# Patient Record
Sex: Male | Born: 1950 | Race: White | Hispanic: No | Marital: Married | Smoking: Never smoker
Health system: Southern US, Community
[De-identification: ages and names within clinical notes are randomized; demographics above are authoritative.]

## PROBLEM LIST (undated history)

## (undated) DIAGNOSIS — Z789 Other specified health status: Secondary | ICD-10-CM

## (undated) HISTORY — PX: COLONOSCOPY: SHX174

---

## 1983-02-09 HISTORY — PX: BACK SURGERY: SHX140

## 1993-02-08 HISTORY — PX: ACHILLES TENDON REPAIR: SUR1153

## 2012-04-18 ENCOUNTER — Encounter (HOSPITAL_BASED_OUTPATIENT_CLINIC_OR_DEPARTMENT_OTHER): Payer: Self-pay | Admitting: *Deleted

## 2012-04-18 NOTE — Progress Notes (Signed)
Denies any ht or resp problems-says he is boring medically.

## 2012-04-19 NOTE — H&P (Signed)
Jordy Hewins/[facility] [address]   [address] [place], [place] [address] [phone] A Division of [facility]  Loreta Ave, M.D.     Robert A. Thurston Hole, M.D.     Lunette Stands, M.D. Eulas Post, M.D.    Buford Dresser, M.D. Estell Harpin, M.D. Genene Churn. Barry Dienes, PA-C            Kirstin A. Shepperson, PA-C Leon, OPA-C   RE: [place], [place]                                [phone]      DOB: Jun 22, 1950 INITIAL EVALUATION:  08-20-11 Paul Dickson comes in as a new patient, evaluation of ongoing worsening problems, left foot.  Insidious onset of symptoms last six months or greater.  Pain in the forefoot between the second and third metatarsal heads.  Gradual onset.  No trauma.  Getting worse rather than better.  Worse in the morning and when he walks.  Occasional shooting pain and numbness out into the second and third toes.  X-ray by his primary care physician, Dr. Reola Calkins, earlier this month he has brought with him.  No evidence of stress fracture.  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  No significant degenerative changes.   Entire history is reviewed, updated and included in the chart.  EXAMINATION: General exam is outlined and included in the chart.  Specifically, healthy appearing 62 year-old.  No significant flexible or fixed deformities in the lower leg, ankle, hindfoot, midfoot or forefoot.  Exquisitely tender between the second and third metatarsal heads on the left.  Very consistent with a neuroma.  No greater or lesser toe abnormalities.  Really nothing over the second MP joint.  Flexors and extensors are intact.  No clawing.    DISPOSITION:  Probable Morton's neuroma between the second and third metatarsal heads, left foot.  Discussed diagnosis and treatment.  We are going to do a diagnostic/therapeutic injection today.  If effective, nothing further.  We did talk at  relative length about definitive treatment with excision if the shot is ineffective.  He understands and agrees.  PROCEDURE NOTE: The patient's clinical condition is marked by substantial pain and/or significant functional disability.  Other conservative therapy has not provided relief, is contraindicated, or not appropriate.  There is a reasonable likelihood that injection will significantly improve the patient's pain and/or functional disability. After appropriate consent and under sterile condition injected from a dorsal approach between the second and third metatarsal heads down to the plantar side.  With Marcaine in place excellent relief of symptoms.  I will wait to hear from him.    Loreta Ave, M.D.   Electronically verified by Loreta Ave, M.D. DFM:jjh D 08-20-11 T 08-23-11  Naja Apperson/[facility] [address]   [address] [place], [place] [address] [phone] A Division of [facility]  Loreta Ave, M.D.   Robert A. Thurston Hole, M.D.   Burnell Blanks, M.D.   Eulas Post, M.D.   Lunette Stands, M.D Buford Dresser, M.D.  Charlsie Quest, M.D.   Estell Harpin, M.D.   Melina Fiddler, M.D. Genene Churn. Barry Dienes, PA-C            Kirstin A. Shepperson, PA-C [place], PA-C [place], [place]  RE: [place], [place]  [phone]      DOB: 11-10-1950 PHONE NOTE: 04-06-12 I spoke with Ramon Dredge on the phone today. Although he got initial relief from his injection with Marcaine into the interdigital space between the 2nd and 3rd metatarsal heads he only had a little improvement after that and it never completely resolved. As we discussed on his visit in July if the shot proved to confirm diagnosis but didn't effect a cure we're going to proceed with excision of his neuroma. It's been discussed with him. I'll see him at the time of intervention.  Loreta Ave, M.D.  Electronically verified by Loreta Ave,  M.D. DFM:kh D 04-06-12 T 04-07-12

## 2012-04-20 ENCOUNTER — Ambulatory Visit (HOSPITAL_BASED_OUTPATIENT_CLINIC_OR_DEPARTMENT_OTHER)
Admission: RE | Admit: 2012-04-20 | Discharge: 2012-04-20 | Disposition: A | Discharge status: Home / Self Care | Payer: BC Managed Care – PPO | Source: Ambulatory Visit | Attending: Orthopedic Surgery | Admitting: Orthopedic Surgery

## 2012-04-20 ENCOUNTER — Encounter (HOSPITAL_BASED_OUTPATIENT_CLINIC_OR_DEPARTMENT_OTHER): Payer: Self-pay

## 2012-04-20 ENCOUNTER — Ambulatory Visit (HOSPITAL_BASED_OUTPATIENT_CLINIC_OR_DEPARTMENT_OTHER): Payer: BC Managed Care – PPO | Admitting: *Deleted

## 2012-04-20 ENCOUNTER — Encounter (HOSPITAL_BASED_OUTPATIENT_CLINIC_OR_DEPARTMENT_OTHER)
Admission: RE | Disposition: A | Discharge status: Home / Self Care | Payer: Self-pay | Source: Ambulatory Visit | Attending: Orthopedic Surgery

## 2012-04-20 ENCOUNTER — Encounter (HOSPITAL_BASED_OUTPATIENT_CLINIC_OR_DEPARTMENT_OTHER): Payer: Self-pay | Admitting: *Deleted

## 2012-04-20 DIAGNOSIS — G5760 Lesion of plantar nerve, unspecified lower limb: Secondary | ICD-10-CM | POA: Insufficient documentation

## 2012-04-20 DIAGNOSIS — G5762 Lesion of plantar nerve, left lower limb: Secondary | ICD-10-CM

## 2012-04-20 HISTORY — DX: Other specified health status: Z78.9

## 2012-04-20 HISTORY — PX: EXCISION MORTON'S NEUROMA: SHX5013

## 2012-04-20 SURGERY — EXCISION, MORTON'S NEUROMA
Anesthesia: General | Site: Foot | Laterality: Left | Wound class: Clean

## 2012-04-20 MED ORDER — DEXAMETHASONE SODIUM PHOSPHATE 10 MG/ML IJ SOLN
INTRAMUSCULAR | Status: DC | PRN
  Administered 2012-04-20: 10 mg via INTRAVENOUS

## 2012-04-20 MED ORDER — FENTANYL CITRATE 0.05 MG/ML IJ SOLN
INTRAMUSCULAR | Status: DC | PRN
  Administered 2012-04-20: 100 ug via INTRAVENOUS
  Administered 2012-04-20: 50 ug via INTRAVENOUS

## 2012-04-20 MED ORDER — CEFAZOLIN SODIUM-DEXTROSE 2-3 GM-% IV SOLR
2.0000 g | INTRAVENOUS | Status: AC
  Administered 2012-04-20: 2 g via INTRAVENOUS

## 2012-04-20 MED ORDER — HYDROMORPHONE HCL PF 1 MG/ML IJ SOLN: 0.2500 mg | INTRAMUSCULAR | Status: DC | PRN

## 2012-04-20 MED ORDER — PROPOFOL 10 MG/ML IV BOLUS
INTRAVENOUS | Status: DC | PRN
  Administered 2012-04-20: 250 mg via INTRAVENOUS

## 2012-04-20 MED ORDER — MIDAZOLAM HCL 2 MG/2ML IJ SOLN: 1.0000 mg | INTRAMUSCULAR | Status: DC | PRN

## 2012-04-20 MED ORDER — BUPIVACAINE HCL (PF) 0.5 % IJ SOLN
INTRAMUSCULAR | Status: DC | PRN
  Administered 2012-04-20: 8 mL

## 2012-04-20 MED ORDER — OXYCODONE-ACETAMINOPHEN 5-325 MG PO TABS: 1.0000 | ORAL_TABLET | Freq: Four times a day (QID) | ORAL | Status: DC | PRN

## 2012-04-20 MED ORDER — OXYCODONE HCL 5 MG/5ML PO SOLN
5.0000 mg | Freq: Once | ORAL | Status: AC | PRN
Start: 2012-04-20 — End: 2012-04-20

## 2012-04-20 MED ORDER — LACTATED RINGERS IV SOLN
INTRAVENOUS | Status: DC
  Administered 2012-04-20 (×2): via INTRAVENOUS

## 2012-04-20 MED ORDER — MIDAZOLAM HCL 5 MG/5ML IJ SOLN
INTRAMUSCULAR | Status: DC | PRN
  Administered 2012-04-20: 2 mg via INTRAVENOUS

## 2012-04-20 MED ORDER — FENTANYL CITRATE 0.05 MG/ML IJ SOLN: 50.0000 ug | INTRAMUSCULAR | Status: DC | PRN

## 2012-04-20 MED ORDER — LIDOCAINE HCL (CARDIAC) 20 MG/ML IV SOLN
INTRAVENOUS | Status: DC | PRN
  Administered 2012-04-20: 60 mg via INTRAVENOUS

## 2012-04-20 MED ORDER — OXYCODONE HCL 5 MG PO TABS
5.0000 mg | ORAL_TABLET | Freq: Once | ORAL | Status: AC | PRN
  Administered 2012-04-20: 5 mg via ORAL

## 2012-04-20 SURGICAL SUPPLY — 53 items
BANDAGE ELASTIC 3 VELCRO ST LF (GAUZE/BANDAGES/DRESSINGS) ×2 IMPLANT
BANDAGE ELASTIC 4 VELCRO ST LF (GAUZE/BANDAGES/DRESSINGS) ×2 IMPLANT
BANDAGE GAUZE ELAST BULKY 4 IN (GAUZE/BANDAGES/DRESSINGS) ×2 IMPLANT
BLADE MINI RND TIP GREEN BEAV (BLADE) ×2 IMPLANT
BLADE SURG 15 STRL LF DISP TIS (BLADE) ×1 IMPLANT
BLADE SURG 15 STRL SS (BLADE) ×1
BNDG COHESIVE 4X5 TAN STRL (GAUZE/BANDAGES/DRESSINGS) ×2 IMPLANT
BNDG ELASTIC 2 VLCR STRL LF (GAUZE/BANDAGES/DRESSINGS) IMPLANT
BNDG ESMARK 4X9 LF (GAUZE/BANDAGES/DRESSINGS) ×2 IMPLANT
CLOTH BEACON ORANGE TIMEOUT ST (SAFETY) ×2 IMPLANT
CORDS BIPOLAR (ELECTRODE) IMPLANT
COVER MAYO STAND STRL (DRAPES) ×2 IMPLANT
COVER TABLE BACK 60X90 (DRAPES) ×2 IMPLANT
CUFF TOURNIQUET SINGLE 18IN (TOURNIQUET CUFF) IMPLANT
DECANTER SPIKE VIAL GLASS SM (MISCELLANEOUS) IMPLANT
DRAPE EXTREMITY T 121X128X90 (DRAPE) ×2 IMPLANT
DRAPE SURG 17X23 STRL (DRAPES) ×2 IMPLANT
DRAPE U 20/CS (DRAPES) ×2 IMPLANT
DRSG PAD ABDOMINAL 8X10 ST (GAUZE/BANDAGES/DRESSINGS) ×4 IMPLANT
DURAPREP 26ML APPLICATOR (WOUND CARE) ×2 IMPLANT
ELECT REM PT RETURN 9FT ADLT (ELECTROSURGICAL) ×2
ELECTRODE REM PT RTRN 9FT ADLT (ELECTROSURGICAL) ×1 IMPLANT
GAUZE XEROFORM 1X8 LF (GAUZE/BANDAGES/DRESSINGS) ×2 IMPLANT
GLOVE BIO SURGEON STRL SZ 6.5 (GLOVE) ×2 IMPLANT
GLOVE BIOGEL PI IND STRL 7.0 (GLOVE) ×1 IMPLANT
GLOVE BIOGEL PI IND STRL 8 (GLOVE) ×1 IMPLANT
GLOVE BIOGEL PI INDICATOR 7.0 (GLOVE) ×1
GLOVE BIOGEL PI INDICATOR 8 (GLOVE) ×1
GLOVE EXAM NITRILE EXT CUFF MD (GLOVE) ×2 IMPLANT
GLOVE ORTHO TXT STRL SZ7.5 (GLOVE) ×4 IMPLANT
GOWN BRE IMP PREV XXLGXLNG (GOWN DISPOSABLE) ×2 IMPLANT
GOWN PREVENTION PLUS XLARGE (GOWN DISPOSABLE) ×2 IMPLANT
GOWN PREVENTION PLUS XXLARGE (GOWN DISPOSABLE) ×2 IMPLANT
NEEDLE HYPO 22GX1.5 SAFETY (NEEDLE) ×2 IMPLANT
NEEDLE HYPO 25X1 1.5 SAFETY (NEEDLE) IMPLANT
NS IRRIG 1000ML POUR BTL (IV SOLUTION) ×2 IMPLANT
PACK BASIN DAY SURGERY FS (CUSTOM PROCEDURE TRAY) ×2 IMPLANT
PAD CAST 3X4 CTTN HI CHSV (CAST SUPPLIES) ×1 IMPLANT
PADDING CAST ABS 4INX4YD NS (CAST SUPPLIES) ×1
PADDING CAST ABS COTTON 4X4 ST (CAST SUPPLIES) ×1 IMPLANT
PADDING CAST COTTON 3X4 STRL (CAST SUPPLIES) ×1
PADDING UNDERCAST 2  STERILE (CAST SUPPLIES) IMPLANT
PENCIL BUTTON HOLSTER BLD 10FT (ELECTRODE) ×2 IMPLANT
SPONGE GAUZE 4X4 12PLY (GAUZE/BANDAGES/DRESSINGS) ×2 IMPLANT
STOCKINETTE 4X48 STRL (DRAPES) ×2 IMPLANT
SUT ETHILON 3 0 PS 1 (SUTURE) ×2 IMPLANT
SUT VIC AB 3-0 SH 27 (SUTURE)
SUT VIC AB 3-0 SH 27X BRD (SUTURE) IMPLANT
SYR BULB 3OZ (MISCELLANEOUS) ×2 IMPLANT
SYR CONTROL 10ML LL (SYRINGE) ×2 IMPLANT
TOWEL OR 17X24 6PK STRL BLUE (TOWEL DISPOSABLE) ×2 IMPLANT
UNDERPAD 30X30 INCONTINENT (UNDERPADS AND DIAPERS) ×2 IMPLANT
WATER STERILE IRR 1000ML POUR (IV SOLUTION) IMPLANT

## 2012-04-20 NOTE — Anesthesia Preprocedure Evaluation (Addendum)
Anesthesia Evaluation  Patient identified by MRN, date of birth, ID band Patient awake    Reviewed: Allergy & Precautions, H&P , NPO status , Patient's Chart, lab work & pertinent test results  Airway Mallampati: II TM Distance: >3 FB Neck ROM: Full    Dental no notable dental hx. (+) Teeth Intact and Dental Advisory Given   Pulmonary neg pulmonary ROS,  breath sounds clear to auscultation  Pulmonary exam normal       Cardiovascular negative cardio ROS  Rhythm:Regular Rate:Normal     Neuro/Psych negative neurological ROS  negative psych ROS   GI/Hepatic negative GI ROS, Neg liver ROS,   Endo/Other  negative endocrine ROS  Renal/GU negative Renal ROS  negative genitourinary   Musculoskeletal   Abdominal   Peds  Hematology negative hematology ROS (+)   Anesthesia Other Findings   Reproductive/Obstetrics negative OB ROS                           Anesthesia Physical Anesthesia Plan  ASA: I  Anesthesia Plan: General   Post-op Pain Management:    Induction: Intravenous  Airway Management Planned: LMA  Additional Equipment:   Intra-op Plan:   Post-operative Plan: Extubation in OR  Informed Consent: I have reviewed the patients History and Physical, chart, labs and discussed the procedure including the risks, benefits and alternatives for the proposed anesthesia with the patient or authorized representative who has indicated his/her understanding and acceptance.   Dental advisory given  Plan Discussed with: CRNA  Anesthesia Plan Comments:         Anesthesia Quick Evaluation  

## 2012-04-20 NOTE — Anesthesia Postprocedure Evaluation (Signed)
  Anesthesia Post-op Note  Patient: Paul Dickson  Procedure(s) Performed: Procedure(s) with comments: EXCISION INTERDIGITAL (MORTON'S) NEUROMA SINGLE EACH (Left) - LEFT FOOT MORTONS NEUROMA BETWEEN 2ND & 3RD METARSAL HEADS  Patient Location: PACU  Anesthesia Type:General  Level of Consciousness: awake, alert  and oriented  Airway and Oxygen Therapy: Patient Spontanous Breathing  Post-op Pain: mild  Post-op Assessment: Post-op Vital signs reviewed, Patient's Cardiovascular Status Stable, Respiratory Function Stable, Patent Airway and No signs of Nausea or vomiting  Post-op Vital Signs: Reviewed and stable  Complications: No apparent anesthesia complications

## 2012-04-20 NOTE — Brief Op Note (Signed)
04/20/2012  8:29 AM  PATIENT:  Paul Dickson  62 y.o. male  PRE-OPERATIVE DIAGNOSIS:  LEFT FOOT MORTONS NEUROMA BETWEEN 2nd and 3rd METATARSAL HEADS, 355.6  POST-OPERATIVE DIAGNOSIS:  LEFT FOOT MORTONS NEUROMA BETWEEN 2nd and 3rd METATARSAL HEADS, 355.6  PROCEDURE:  Procedure(s) with comments: EXCISION INTERDIGITAL (MORTON'S) NEUROMA SINGLE EACH (Left) - LEFT FOOT MORTONS NEUROMA BETWEEN 2ND & 3RD METARSAL HEADS  SURGEON:  Surgeon(s) and Role:    * Loreta Ave, MD - Primary  PHYSICIAN ASSISTANT: Zonia Kief M    ANESTHESIA:   general  EBL:  Total I/O In: 1000 [I.V.:1000] Out: -    SPECIMEN:  neuroma  DISPOSITION OF SPECIMEN:  PATHOLOGY  COUNTS:  YES  TOURNIQUET:   Total Tourniquet Time Documented: Calf (Left) - 21 minutes Total: Calf (Left) - 21 minutes   PATIENT DISPOSITION:  PACU - hemodynamically stable.

## 2012-04-20 NOTE — Interval H&P Note (Signed)
History and Physical Interval Note:  04/20/2012 7:32 AM  Paul Dickson  has presented today for surgery, with the diagnosis of LEFT FOOT MORTONS NEUROMA BETWEEN 2nd and 3rd METATARSAL HEADS, 355.6  The various methods of treatment have been discussed with the patient and family. After consideration of risks, benefits and other options for treatment, the patient has consented to  Procedure(s) with comments: EXCISION INTERDIGITAL (MORTON'S) NEUROMA SINGLE EACH (Left) - LEFT FOOT MORTONS NEUROMA BETWEEN 2ND & 3RD METARSAL HEADS as a surgical intervention .  The patient's history has been reviewed, patient examined, no change in status, stable for surgery.  I have reviewed the patient's chart and labs.  Questions were answered to the patient's satisfaction.     MURPHY,DANIEL F

## 2012-04-20 NOTE — Transfer of Care (Signed)
Immediate Anesthesia Transfer of Care Note  Patient: Paul Dickson  Procedure(s) Performed: Procedure(s) with comments: EXCISION INTERDIGITAL (MORTON'S) NEUROMA SINGLE EACH (Left) - LEFT FOOT MORTONS NEUROMA BETWEEN 2ND & 3RD METARSAL HEADS  Patient Location: PACU  Anesthesia Type:General  Level of Consciousness: awake, alert  and oriented  Airway & Oxygen Therapy: Patient Spontanous Breathing and Patient connected to face mask oxygen  Post-op Assessment: Report given to PACU RN, Post -op Vital signs reviewed and stable and Patient moving all extremities  Post vital signs: Reviewed and stable  Complications: No apparent anesthesia complications

## 2012-04-20 NOTE — Anesthesia Procedure Notes (Signed)
Procedure Name: LMA Insertion Date/Time: 04/20/2012 7:44 AM Performed by: Meyer Russel Pre-anesthesia Checklist: Patient identified, Emergency Drugs available, Suction available and Patient being monitored Patient Re-evaluated:Patient Re-evaluated prior to inductionOxygen Delivery Method: Circle System Utilized Preoxygenation: Pre-oxygenation with 100% oxygen Intubation Type: IV induction Ventilation: Mask ventilation without difficulty LMA: LMA inserted LMA Size: 5.0 Number of attempts: 1 Airway Equipment and Method: bite block Placement Confirmation: positive ETCO2 and breath sounds checked- equal and bilateral Tube secured with: Tape Dental Injury: Teeth and Oropharynx as per pre-operative assessment

## 2012-04-21 ENCOUNTER — Encounter (HOSPITAL_BASED_OUTPATIENT_CLINIC_OR_DEPARTMENT_OTHER): Payer: Self-pay | Admitting: Orthopedic Surgery

## 2012-04-24 NOTE — Op Note (Signed)
NAME:  Paul Dickson, Paul Dickson              ACCOUNT NO.:  [id number]  MEDICAL RECORD NO.:  [id number]  LOCATION:                               FACILITY:  [facility]  PHYSICIAN:  Loreta Ave, M.D. DATE OF BIRTH:  Jan 18, 1951  DATE OF PROCEDURE:  04/20/2012 DATE OF DISCHARGE:  04/20/2012                              OPERATIVE REPORT   PREOPERATIVE DIAGNOSIS:  Symptomatic Morton neuroma, left foot between second and third metatarsal heads.  POSTOPERATIVE DIAGNOSIS:  Symptomatic Morton neuroma, left foot between second and third metatarsal heads.  PROCEDURE:  Excision of Morton neuroma, left foot between second and third metatarsal heads.  SURGEON:  Loreta Ave, MD  ASSISTANT:  Genene Churn. Barry Dienes, PA  ANESTHESIA:  General.  BLOOD LOSS:  Minimal.  SPECIMENS:  Excised neuroma.  CULTURES:  None.  COMPLICATIONS:  None.  DRESSINGS:  Soft compressive wooden shoe.  TOURNIQUET TIME:  30 minutes.  PROCEDURE:  The patient was brought to the operating room, placed on the operating table in supine position.  After adequate anesthesia had been obtained, calf tourniquet applied.  Prepped and draped in usual sterile fashion.  Exsanguinated with elevation of Esmarch.  Tourniquet inflated to 250 mmHg.  Longitudinal incision in the web space between the second and third metatarsal heads.  Extended proximal and distal.  Web space exposed.  The enlarged Morton neuroma at the bifurcation of nerve identified just distal to metatarsal heads.  I split the intermetatarsal ligaments so I could get well proximal to excise this approximately, brought out distal and incised distally and resected it.  Sent for specimen.  No other significant abnormalities in web space.  Wound irrigated. Retractor removed.  Wound closed with nylon.  Margins were injected with Marcaine.  Sterile compressive dressing applied.  Tourniquet deflated and removed.  Anesthesia reversed.  Brought to the recovery room. Tolerated the  surgery well.  No complications.     Loreta Ave, M.D.     DFM/MEDQ  D:  04/20/2012  T:  04/21/2012  Job:  [id number]

## 2016-04-20 ENCOUNTER — Other Ambulatory Visit: Payer: Self-pay | Admitting: Surgery

## 2016-04-20 DIAGNOSIS — R2242 Localized swelling, mass and lump, left lower limb: Secondary | ICD-10-CM

## 2016-04-30 ENCOUNTER — Ambulatory Visit
Admission: RE | Admit: 2016-04-30 | Discharge: 2016-04-30 | Disposition: A | Discharge status: Home / Self Care | Payer: BLUE CROSS/BLUE SHIELD | Source: Ambulatory Visit | Attending: Surgery | Admitting: Surgery

## 2016-04-30 DIAGNOSIS — R2242 Localized swelling, mass and lump, left lower limb: Secondary | ICD-10-CM

## 2016-04-30 MED ORDER — GADOBENATE DIMEGLUMINE 529 MG/ML IV SOLN
16.0000 mL | Freq: Once | INTRAVENOUS | Status: AC | PRN
  Administered 2016-04-30: 16 mL via INTRAVENOUS

## 2016-05-04 ENCOUNTER — Other Ambulatory Visit (HOSPITAL_COMMUNITY): Payer: Self-pay | Admitting: Surgery

## 2016-05-04 DIAGNOSIS — R2242 Localized swelling, mass and lump, left lower limb: Secondary | ICD-10-CM

## 2016-05-13 ENCOUNTER — Other Ambulatory Visit: Payer: Self-pay | Admitting: Radiology

## 2016-05-14 ENCOUNTER — Encounter (HOSPITAL_COMMUNITY): Payer: Self-pay

## 2016-05-14 ENCOUNTER — Ambulatory Visit (HOSPITAL_COMMUNITY)
Admission: RE | Admit: 2016-05-14 | Discharge: 2016-05-14 | Disposition: A | Discharge status: Home / Self Care | Payer: BLUE CROSS/BLUE SHIELD | Source: Ambulatory Visit | Attending: Surgery | Admitting: Surgery

## 2016-05-14 DIAGNOSIS — R2242 Localized swelling, mass and lump, left lower limb: Secondary | ICD-10-CM | POA: Diagnosis not present

## 2016-05-14 LAB — CBC
HEMATOCRIT: 44.5 % (ref 39.0–52.0)
Hemoglobin: 14.9 g/dL (ref 13.0–17.0)
MCH: 31.5 pg (ref 26.0–34.0)
MCHC: 33.5 g/dL (ref 30.0–36.0)
MCV: 94.1 fL (ref 78.0–100.0)
Platelets: 262 10*3/uL (ref 150–400)
RBC: 4.73 MIL/uL (ref 4.22–5.81)
RDW: 13.8 % (ref 11.5–15.5)
WBC: 5.7 10*3/uL (ref 4.0–10.5)

## 2016-05-14 LAB — PROTIME-INR
INR: 0.99
Prothrombin Time: 13.1 seconds (ref 11.4–15.2)

## 2016-05-14 LAB — APTT: aPTT: 32 seconds (ref 24–36)

## 2016-05-14 MED ORDER — LIDOCAINE HCL 1 % IJ SOLN
INTRAMUSCULAR | Status: AC
  Filled 2016-05-14: qty 20

## 2016-05-14 MED ORDER — MIDAZOLAM HCL 2 MG/2ML IJ SOLN
INTRAMUSCULAR | Status: AC
  Filled 2016-05-14: qty 2

## 2016-05-14 MED ORDER — FENTANYL CITRATE (PF) 100 MCG/2ML IJ SOLN
INTRAMUSCULAR | Status: AC
  Filled 2016-05-14: qty 2

## 2016-05-14 MED ORDER — FENTANYL CITRATE (PF) 100 MCG/2ML IJ SOLN
INTRAMUSCULAR | Status: AC | PRN
  Administered 2016-05-14 (×2): 50 ug via INTRAVENOUS

## 2016-05-14 MED ORDER — SODIUM CHLORIDE 0.9 % IV SOLN: INTRAVENOUS | Status: DC

## 2016-05-14 MED ORDER — MIDAZOLAM HCL 2 MG/2ML IJ SOLN
INTRAMUSCULAR | Status: AC | PRN
  Administered 2016-05-14 (×2): 1 mg via INTRAVENOUS

## 2016-05-14 NOTE — Discharge Instructions (Signed)
Needle Biopsy, Care After These instructions give you information about caring for yourself after your procedure. Your doctor may also give you more specific instructions. Call your doctor if you have any problems or questions after your procedure. Follow these instructions at home:  Rest as told by your doctor.  Take medicines only as told by your doctor.  There are many different ways to close and cover the biopsy site, including stitches (sutures), skin glue, and adhesive strips. Follow instructions from your doctor about:  How to take care of your biopsy site.  When and how you should change your bandage (dressing).  When you should remove your dressing.  Removing whatever was used to close your biopsy site.  Check your biopsy site every day for signs of infection. Watch for:  Redness, swelling, or pain.  Fluid, blood, or pus. Contact a doctor if:  You have a fever.  You have redness, swelling, or pain at the biopsy site, and it lasts longer than a few days.  You have fluid, blood, or pus coming from the biopsy site.  You feel sick to your stomach (nauseous).  You throw up (vomit). Get help right away if:  You are short of breath.  You have trouble breathing.  Your chest hurts.  You feel dizzy or you pass out (faint).  You have bleeding that does not stop with pressure or a bandage.  You cough up blood.  Your belly (abdomen) hurts. This information is not intended to replace advice given to you by your health care provider. Make sure you discuss any questions you have with your health care provider. Document Released: 01/08/2008 Document Revised: 07/03/2015 Document Reviewed: 01/21/2014 Elsevier Interactive Patient Education  2017 [employer]. Moderate Conscious Sedation, Adult, Care After These instructions provide you with information about caring for yourself after your procedure. Your health care provider may also give you more specific instructions.  Your treatment has been planned according to current medical practices, but problems sometimes occur. Call your health care provider if you have any problems or questions after your procedure. What can I expect after the procedure? After your procedure, it is common:  To feel sleepy for several hours.  To feel clumsy and have poor balance for several hours.  To have poor judgment for several hours.  To vomit if you eat too soon. Follow these instructions at home: For at least 24 hours after the procedure:    Do not:  Participate in activities where you could fall or become injured.  Drive.  Use heavy machinery.  Drink alcohol.  Take sleeping pills or medicines that cause drowsiness.  Make important decisions or sign legal documents.  Take care of children on your own.  Rest. Eating and drinking   Follow the diet recommended by your health care provider.  If you vomit:  Drink water, juice, or soup when you can drink without vomiting.  Make sure you have little or no nausea before eating solid foods. General instructions   Have a responsible adult stay with you until you are awake and alert.  Take over-the-counter and prescription medicines only as told by your health care provider.  If you smoke, do not smoke without supervision.  Keep all follow-up visits as told by your health care provider. This is important. Contact a health care provider if:  You keep feeling nauseous or you keep vomiting.  You feel light-headed.  You develop a rash.  You have a fever. Get help right away if:  You have trouble breathing. This information is not intended to replace advice given to you by your health care provider. Make sure you discuss any questions you have with your health care provider. Document Released: 11/15/2012 Document Revised: 06/30/2015 Document Reviewed: 05/17/2015 Elsevier Interactive Patient Education  2017 [employer]. Needle Biopsy, Care  After These instructions give you information about caring for yourself after your procedure. Your doctor may also give you more specific instructions. Call your doctor if you have any problems or questions after your procedure. Follow these instructions at home:  Rest as told by your doctor.  Take medicines only as told by your doctor.  There are many different ways to close and cover the biopsy site, including stitches (sutures), skin glue, and adhesive strips. Follow instructions from your doctor about:  How to take care of your biopsy site.  When and how you should change your bandage (dressing).  When you should remove your dressing.  Removing whatever was used to close your biopsy site.  Check your biopsy site every day for signs of infection. Watch for:  Redness, swelling, or pain.  Fluid, blood, or pus. Contact a doctor if:  You have a fever.  You have redness, swelling, or pain at the biopsy site, and it lasts longer than a few days.  You have fluid, blood, or pus coming from the biopsy site.  You feel sick to your stomach (nauseous).  You throw up (vomit). Get help right away if:  You are short of breath.  You have trouble breathing.  Your chest hurts.  You feel dizzy or you pass out (faint).  You have bleeding that does not stop with pressure or a bandage.  You cough up blood.  Your belly (abdomen) hurts. This information is not intended to replace advice given to you by your health care provider. Make sure you discuss any questions you have with your health care provider. Document Released: 01/08/2008 Document Revised: 07/03/2015 Document Reviewed: 01/21/2014 Elsevier Interactive Patient Education  2017 [employer].

## 2016-05-14 NOTE — Sedation Documentation (Signed)
Patient is resting comfortably. 

## 2016-05-14 NOTE — Procedures (Signed)
Left thigh muscular mass  s/p [place] CORE BIOPSY  No comp Stable EBL 0 PATH pending Full report in PACS

## 2016-05-14 NOTE — H&P (Signed)
Chief Complaint: Patient was seen in consultation today for left thigh mass biopsy at the request of Cornett,Thomas  Referring Physician(s): Cornett,Thomas  Supervising Physician: Daryll Brod  Patient Status: [facility] - Out-pt  History of Present Illness: Paul Dickson is a 66 y.o. male   Pt has noticed left thigh mass x 2 months Denies injury Slightly painful. Maybe larger now than when first noticed MRI 3/23: IMPRESSION: 1. 2 x 3 x 3.7 cm soft tissue mass in the proximal vastus medialis muscle. Differential diagnosis includes peripheral nerve sheath tumor versus myxoma versus soft tissue sarcoma such as synovial cell sarcoma or pleomorphic undifferentiated sarcoma. Tissue diagnosis is recommended.  Scheduled now for biopsy of same  Past Medical History:  Diagnosis Date  . Medical history non-contributory     Past Surgical History:  Procedure Laterality Date  . [place]   rt  . BACK SURGERY  1985   lumb lam  . COLONOSCOPY    . EXCISION MORTON'S NEUROMA Left 04/20/2012   Procedure: EXCISION INTERDIGITAL ([place]) NEUROMA SINGLE EACH;  Surgeon: Ninetta Lights, MD;  Location: [place];  Service: Orthopedics;  Laterality: Left;  LEFT FOOT MORTONS NEUROMA BETWEEN 2ND & 3RD METARSAL HEADS    Allergies: Patient has no known allergies.  Medications: Prior to Admission medications   Medication Sig Start Date End Date Taking? Authorizing Provider  Multiple Vitamins-Minerals (MULTIVITAMIN WITH MINERALS) tablet Take 1 tablet by mouth daily.   Yes Historical Provider, MD     History reviewed. No pertinent family history.  Social History   Social History  . Marital status: Married    Spouse name: N/A  . Number of children: N/A  . Years of education: N/A   Social History Main Topics  . Smoking status: Never Smoker  . Smokeless tobacco: None  . Alcohol use Yes     Comment: occ wine  . Drug use: No  . Sexual activity: Not  Asked   Other Topics Concern  . None   Social History Narrative  . None    Review of Systems: A 12 point ROS discussed and pertinent positives are indicated in the HPI above.  All other systems are negative.  Review of Systems  Constitutional: Negative for activity change, appetite change and fatigue.  Respiratory: Negative for cough and shortness of breath.   Musculoskeletal: Negative for back pain and gait problem.  Neurological: Negative for weakness.  Psychiatric/Behavioral: Negative for confusion and decreased concentration.    Vital Signs: BP 127/77 (BP Location: Right Arm)   Pulse 60   Temp 97.8 F (36.6 C) (Oral)   Resp 18   Ht 6\' 2"  (1.88 m)   Wt 180 lb (81.6 kg)   SpO2 100%   BMI 23.11 kg/m   Physical Exam  Constitutional: He is oriented to person, place, and time.  Cardiovascular: Normal rate, regular rhythm and normal heart sounds.   Pulmonary/Chest: Effort normal and breath sounds normal. He has no wheezes.  Abdominal: Soft. Bowel sounds are normal. There is no tenderness.  Musculoskeletal: Normal range of motion.  Neurological: He is alert and oriented to person, place, and time.  Skin: Skin is warm and dry.  Psychiatric: He has a normal mood and affect. His behavior is normal. Thought content normal.  Nursing note and vitals reviewed.   Mallampati Score:  MD Evaluation Airway: WNL Heart: WNL Abdomen: WNL Chest/ Lungs: WNL ASA  Classification: 2 Mallampati/Airway Score: One  Imaging: Mr  Femur Left W Wo Contrast  Result Date: 04/30/2016 CLINICAL DATA:  Left upper thigh lump for 2 months. EXAM: MR OF THE LEFT LOWER EXTREMITY WITHOUT AND WITH CONTRAST TECHNIQUE: Multiplanar, multisequence MR imaging of the left thigh was performed both before and after administration of intravenous contrast. CONTRAST:  12mL MULTIHANCE GADOBENATE DIMEGLUMINE 529 MG/ML IV SOLN COMPARISON:  None. FINDINGS: Bones/Joint/Cartilage No marrow signal abnormality. No fracture  or dislocation. Normal alignment. No joint effusion. Muscles and Tendons 2 x 3 x 3.7 cm T2 hyperintense, T1 hypointense heterogeneously enhancing mass in the proximal vastus medialis muscle. No other soft tissue mass. Muscles are otherwise normal. No muscle atrophy. Soft tissue No fluid collection or hematoma.  No other soft tissue mass. IMPRESSION: 1. 2 x 3 x 3.7 cm soft tissue mass in the proximal vastus medialis muscle. Differential diagnosis includes peripheral nerve sheath tumor versus myxoma versus soft tissue sarcoma such as synovial cell sarcoma or pleomorphic undifferentiated sarcoma. Tissue diagnosis is recommended. Electronically Signed   By: Kathreen Devoid   On: 04/30/2016 14:52    Labs:  CBC:  Recent Labs  05/14/16 1146  WBC 5.7  HGB 14.9  HCT 44.5  PLT 262    COAGS:  Recent Labs  05/14/16 1146  INR 0.99  APTT 32    BMP: No results for input(s): NA, K, CL, CO2, GLUCOSE, BUN, CALCIUM, CREATININE, GFRNONAA, GFRAA in the last 8760 hours.  Invalid input(s): CMP  LIVER FUNCTION TESTS: No results for input(s): BILITOT, AST, ALT, ALKPHOS, PROT, ALBUMIN in the last 8760 hours.  TUMOR MARKERS: No results for input(s): AFPTM, CEA, CA199, CHROMGRNA in the last 8760 hours.  Assessment and Plan:  Left thigh mass For biopsy today Risks and Benefits discussed with the patient including, but not limited to bleeding, infection, damage to adjacent structures or low yield requiring additional tests. All of the patient's questions were answered, patient is agreeable to proceed. Consent signed and in chart.   Thank you for this interesting consult.  I greatly enjoyed meeting ANTONIA CULBERTSON and look forward to participating in their care.  A copy of this report was sent to the requesting provider on this date.  Electronically Signed: Monia Sabal A 05/14/2016, 12:54 PM   I spent a total of  30 Minutes   in face to face in clinical consultation, greater than 50% of which was  counseling/coordinating care for left thigh mass bx

## 2016-06-11 ENCOUNTER — Ambulatory Visit: Payer: Self-pay | Admitting: Surgery

## 2016-06-11 NOTE — H&P (Signed)
dward DERRIS MILLAN 06/11/2016 10:32 AM Location: [facility] Patient #: [id number] DOB: 1950/06/21 Married / Language: Cleophus Molt / Race: White Male  History of Present Illness Marcello Moores A. Jennie Bolar MD; 06/11/2016 10:53 AM) Patient words: Patient returns for discussion of surgical treatment of his left thigh mass. MRI showed a 3-centimeter mass lesion in the vastus medialis muscle. 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 I discussed with him today excision. He has no new complaints.                   Diagnosis Soft Tissue Needle Core Biopsy, Left thigh - MYXOID TUMOR. - SEE MICROSCOPIC DESCRIPTION Microscopic Comment The core biopsies show fragments of myxoid tumor characterized by low cellularity, elongated to stellate shaped cells with scattered small indistinct vessels. There is minimal atypia with low cellularity, no necrosis and no mitotic activity identified. Immunohistochemistry shows patchy positivity with CD34, negative staining with CD117, CD99, desmin, muscle specific actin, S100 and smooth muscle actin shows patchy, weak positivity. Vimentin highlights the spindle cells. 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 If clinically feasible, excision is suggested for definitive characterization. Dr. Tresa Moore has reviewed this case and agrees. (JDP:kh 05-18-16) Claudette Laws MD Pathologist, [employer]             CLINICAL DATA: Left upper thigh lump for 2 months.  EXAM: MR OF THE LEFT LOWER EXTREMITY WITHOUT AND WITH CONTRAST  TECHNIQUE: Multiplanar, multisequence MR imaging of the left thigh was performed both before and after administration of intravenous contrast.  CONTRAST: 42m MULTIHANCE GADOBENATE DIMEGLUMINE 529 MG/ML IV SOLN  COMPARISON: None.  FINDINGS: Bones/Joint/Cartilage  No marrow signal abnormality. No fracture or  dislocation. Normal alignment. No joint effusion.  Muscles and Tendons 2 x 3 x 3.7 cm T2 hyperintense, T1 hypointense heterogeneously enhancing mass in the proximal vastus medialis muscle.  No other soft tissue mass. Muscles are otherwise normal. No muscle atrophy.  Soft tissue No fluid collection or hematoma. No other soft tissue mass.  IMPRESSION: 1. 2 x 3 x 3.7 cm soft tissue mass in the proximal vastus medialis muscle. Differential diagnosis includes peripheral nerve sheath tumor versus myxoma versus soft tissue sarcoma such as synovial cell sarcoma or pleomorphic undifferentiated sarcoma. Tissue diagnosis is recommended.  The patient is a 66year old male.   Allergies (Jerrye Bushy RUtah 06/11/2016 10:32 AM) No Known Drug Allergies 04/12/2016 Allergies Reconciled  Medication History (Jerrye Bushy RUtah 06/11/2016 10:33 AM) Multi Vitamin Daily (Oral) Active. Medications Reconciled    Vitals ([employer] R[employer]; 06/11/2016 10:33 AM) 06/11/2016 10:33 AM Weight: 178 lb Height: 74in Body Surface Area: 2.07 m Body Mass Index: 22.85 kg/m  Temp.: 98.34F  Pulse: 74 (Regular)  P.OX: 98% (Room air) BP: 122/80 (Sitting, Left Arm, Standard)      Physical Exam (Tanea Moga A. Waleed Dettman MD; 06/11/2016 10:53 AM)  General Mental Status-Alert. General Appearance-Consistent with stated age. Hydration-Well hydrated. Voice-Normal.  Musculoskeletal Note: Left medial thigh mass a 4 cm fixed mass. This is in the mid to proximal left medial thigh.    Assessment & Plan (Kellyann Ordway A. Rodrigues Urbanek MD; 06/11/2016 10:54 AM)  MASS OF LEFT THIGH (R22.42) Impression: Discussed MRI and pathology results. 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 Characteristics favorable process but excision is recommended. Risks, benefits and all turns to surgery were discussed. Risk of bleeding, infection, nerve injury, blood vessel injury, muscle weakness, numbness,  need physical therapy,  the need for further surgery, the need for the therapies if malignant features identified, recurrence, DVT, cardiovascular events discussed. He agreed to proceed.  Current Plans You are being scheduled for surgery- Our schedulers will call you.  You should hear from our office's scheduling department within 5 working days about the location, date, and time of surgery. We try to make accommodations for patient's preferences in scheduling surgery, but sometimes the OR schedule or the surgeon's schedule prevents [place] from making those accommodations.  If you have not heard from our office [phone]) in 5 working days, call the office and ask for your surgeon's nurse.  If you have other questions about your diagnosis, plan, or surgery, call the office and ask for your surgeon's nurse.  The pathophysiology of skin & subcutaneous masses was discussed. Natural history risks without surgery were discussed. I recommended surgery to remove the mass. I explained the technique of removal with use of local anesthesia & possible need for more aggressive sedation/anesthesia for patient comfort.  Risks such as bleeding, infection, wound breakdown, heart attack, death, and other risks were discussed. I noted a good likelihood this will help address the problem. Possibility that this will not correct all symptoms was explained. Possibility of regrowth/recurrence of the mass was discussed. We will work to minimize complications. Questions were answered. The patient expresses understanding & wishes to proceed with surgery.

## 2016-07-20 ENCOUNTER — Encounter (HOSPITAL_BASED_OUTPATIENT_CLINIC_OR_DEPARTMENT_OTHER): Payer: Self-pay | Admitting: *Deleted

## 2016-07-26 ENCOUNTER — Ambulatory Visit (HOSPITAL_BASED_OUTPATIENT_CLINIC_OR_DEPARTMENT_OTHER): Payer: Medicare Other | Admitting: Anesthesiology

## 2016-07-26 ENCOUNTER — Encounter (HOSPITAL_BASED_OUTPATIENT_CLINIC_OR_DEPARTMENT_OTHER): Payer: Self-pay

## 2016-07-26 ENCOUNTER — Ambulatory Visit (HOSPITAL_COMMUNITY)
Admission: RE | Admit: 2016-07-26 | Discharge: 2016-07-26 | Disposition: A | Discharge status: Home / Self Care | Payer: Medicare Other | Source: Ambulatory Visit | Attending: Surgery | Admitting: Surgery

## 2016-07-26 ENCOUNTER — Encounter (HOSPITAL_BASED_OUTPATIENT_CLINIC_OR_DEPARTMENT_OTHER)
Admission: RE | Disposition: A | Discharge status: Home / Self Care | Payer: Self-pay | Source: Ambulatory Visit | Attending: Surgery

## 2016-07-26 DIAGNOSIS — D2122 Benign neoplasm of connective and other soft tissue of left lower limb, including hip: Secondary | ICD-10-CM | POA: Insufficient documentation

## 2016-07-26 DIAGNOSIS — R2242 Localized swelling, mass and lump, left lower limb: Secondary | ICD-10-CM | POA: Diagnosis present

## 2016-07-26 DIAGNOSIS — Z9889 Other specified postprocedural states: Secondary | ICD-10-CM | POA: Diagnosis not present

## 2016-07-26 HISTORY — PX: LIPOMA EXCISION: SHX5283

## 2016-07-26 SURGERY — EXCISION LIPOMA
Anesthesia: General | Site: Leg Upper | Laterality: Left

## 2016-07-26 MED ORDER — KETOROLAC TROMETHAMINE 30 MG/ML IJ SOLN: 30.0000 mg | Freq: Once | INTRAMUSCULAR | Status: DC | PRN

## 2016-07-26 MED ORDER — FENTANYL CITRATE (PF) 100 MCG/2ML IJ SOLN
INTRAMUSCULAR | Status: AC
  Filled 2016-07-26: qty 2

## 2016-07-26 MED ORDER — CELECOXIB 200 MG PO CAPS
ORAL_CAPSULE | ORAL | Status: AC
  Filled 2016-07-26: qty 2

## 2016-07-26 MED ORDER — MIDAZOLAM HCL 2 MG/2ML IJ SOLN
1.0000 mg | INTRAMUSCULAR | Status: DC | PRN
  Administered 2016-07-26: 2 mg via INTRAVENOUS

## 2016-07-26 MED ORDER — ONDANSETRON HCL 4 MG/2ML IJ SOLN
INTRAMUSCULAR | Status: AC
  Filled 2016-07-26: qty 2

## 2016-07-26 MED ORDER — CELECOXIB 400 MG PO CAPS
400.0000 mg | ORAL_CAPSULE | ORAL | Status: AC
  Administered 2016-07-26: 400 mg via ORAL

## 2016-07-26 MED ORDER — DEXAMETHASONE SODIUM PHOSPHATE 10 MG/ML IJ SOLN
INTRAMUSCULAR | Status: AC
  Filled 2016-07-26: qty 1

## 2016-07-26 MED ORDER — LACTATED RINGERS IV SOLN
INTRAVENOUS | Status: DC
  Administered 2016-07-26: 07:00:00 via INTRAVENOUS

## 2016-07-26 MED ORDER — BUPIVACAINE-EPINEPHRINE 0.25% -1:200000 IJ SOLN
INTRAMUSCULAR | Status: DC | PRN
  Administered 2016-07-26: 20 mL

## 2016-07-26 MED ORDER — PROPOFOL 10 MG/ML IV BOLUS
INTRAVENOUS | Status: DC | PRN
Start: 2016-07-26 — End: 2016-07-26
  Administered 2016-07-26: 200 mg via INTRAVENOUS

## 2016-07-26 MED ORDER — PROPOFOL 500 MG/50ML IV EMUL
INTRAVENOUS | Status: AC
  Filled 2016-07-26: qty 50

## 2016-07-26 MED ORDER — DEXAMETHASONE SODIUM PHOSPHATE 4 MG/ML IJ SOLN
INTRAMUSCULAR | Status: DC | PRN
  Administered 2016-07-26: 4 mg via INTRAVENOUS

## 2016-07-26 MED ORDER — SCOPOLAMINE 1 MG/3DAYS TD PT72: 1.0000 | MEDICATED_PATCH | Freq: Once | TRANSDERMAL | Status: DC | PRN

## 2016-07-26 MED ORDER — PROMETHAZINE HCL 25 MG/ML IJ SOLN: 6.2500 mg | INTRAMUSCULAR | Status: DC | PRN

## 2016-07-26 MED ORDER — LIDOCAINE 2% (20 MG/ML) 5 ML SYRINGE
INTRAMUSCULAR | Status: AC
  Filled 2016-07-26: qty 5

## 2016-07-26 MED ORDER — FENTANYL CITRATE (PF) 100 MCG/2ML IJ SOLN: 25.0000 ug | INTRAMUSCULAR | Status: DC | PRN

## 2016-07-26 MED ORDER — EPHEDRINE SULFATE-NACL 50-0.9 MG/10ML-% IV SOSY
PREFILLED_SYRINGE | INTRAVENOUS | Status: DC | PRN
  Administered 2016-07-26 (×2): 10 mg via INTRAVENOUS

## 2016-07-26 MED ORDER — BUPIVACAINE-EPINEPHRINE (PF) 0.5% -1:200000 IJ SOLN
INTRAMUSCULAR | Status: AC
Start: 2016-07-26 — End: 2016-07-26
  Filled 2016-07-26: qty 30

## 2016-07-26 MED ORDER — IBUPROFEN 800 MG PO TABS
800.0000 mg | ORAL_TABLET | Freq: Three times a day (TID) | ORAL | 0 refills | Status: AC | PRN
Start: 2016-07-26 — End: ?

## 2016-07-26 MED ORDER — MIDAZOLAM HCL 2 MG/2ML IJ SOLN
INTRAMUSCULAR | Status: AC
  Filled 2016-07-26: qty 2

## 2016-07-26 MED ORDER — ACETAMINOPHEN 500 MG PO TABS
ORAL_TABLET | ORAL | Status: AC
Start: 2016-07-26 — End: 2016-07-26
  Filled 2016-07-26: qty 2

## 2016-07-26 MED ORDER — ACETAMINOPHEN 500 MG PO TABS
1000.0000 mg | ORAL_TABLET | ORAL | Status: AC
  Administered 2016-07-26: 1000 mg via ORAL

## 2016-07-26 MED ORDER — CHLORHEXIDINE GLUCONATE CLOTH 2 % EX PADS: 6.0000 | MEDICATED_PAD | Freq: Once | CUTANEOUS | Status: DC

## 2016-07-26 MED ORDER — LIDOCAINE 2% (20 MG/ML) 5 ML SYRINGE
INTRAMUSCULAR | Status: DC | PRN
  Administered 2016-07-26: 100 mg via INTRAVENOUS

## 2016-07-26 MED ORDER — GABAPENTIN 300 MG PO CAPS
ORAL_CAPSULE | ORAL | Status: AC
  Filled 2016-07-26: qty 1

## 2016-07-26 MED ORDER — EPHEDRINE 5 MG/ML INJ
INTRAVENOUS | Status: AC
  Filled 2016-07-26: qty 10

## 2016-07-26 MED ORDER — ONDANSETRON HCL 4 MG/2ML IJ SOLN
INTRAMUSCULAR | Status: DC | PRN
  Administered 2016-07-26: 10 mg via INTRAVENOUS

## 2016-07-26 MED ORDER — OXYCODONE HCL 5 MG PO TABS
5.0000 mg | ORAL_TABLET | Freq: Four times a day (QID) | ORAL | 0 refills | Status: AC | PRN
Start: 2016-07-26 — End: ?

## 2016-07-26 MED ORDER — FENTANYL CITRATE (PF) 100 MCG/2ML IJ SOLN
50.0000 ug | INTRAMUSCULAR | Status: DC | PRN
  Administered 2016-07-26: 100 ug via INTRAVENOUS

## 2016-07-26 MED ORDER — CEFAZOLIN SODIUM-DEXTROSE 2-4 GM/100ML-% IV SOLN
INTRAVENOUS | Status: AC
  Filled 2016-07-26: qty 100

## 2016-07-26 MED ORDER — OXYCODONE HCL 5 MG PO TABS: 5.0000 mg | ORAL_TABLET | Freq: Once | ORAL | Status: DC | PRN

## 2016-07-26 MED ORDER — OXYCODONE HCL 5 MG/5ML PO SOLN
5.0000 mg | Freq: Once | ORAL | Status: DC | PRN
Start: 2016-07-26 — End: 2016-07-26

## 2016-07-26 MED ORDER — GABAPENTIN 300 MG PO CAPS
300.0000 mg | ORAL_CAPSULE | ORAL | Status: AC
  Administered 2016-07-26: 300 mg via ORAL

## 2016-07-26 MED ORDER — CEFAZOLIN SODIUM-DEXTROSE 2-4 GM/100ML-% IV SOLN: 2.0000 g | INTRAVENOUS | Status: DC

## 2016-07-26 SURGICAL SUPPLY — 44 items
BENZOIN TINCTURE PRP APPL 2/3 (GAUZE/BANDAGES/DRESSINGS) IMPLANT
BLADE SURG 10 STRL SS (BLADE) IMPLANT
BLADE SURG 15 STRL LF DISP TIS (BLADE) ×1 IMPLANT
BLADE SURG 15 STRL SS (BLADE) ×2
CANISTER SUCT 1200ML W/VALVE (MISCELLANEOUS) IMPLANT
CHLORAPREP W/TINT 26ML (MISCELLANEOUS) ×3 IMPLANT
CLOSURE WOUND 1/2 X4 (GAUZE/BANDAGES/DRESSINGS)
COVER BACK TABLE 60X90IN (DRAPES) ×3 IMPLANT
COVER MAYO STAND STRL (DRAPES) ×3 IMPLANT
DECANTER SPIKE VIAL GLASS SM (MISCELLANEOUS) IMPLANT
DERMABOND ADVANCED (GAUZE/BANDAGES/DRESSINGS) ×2
DERMABOND ADVANCED .7 DNX12 (GAUZE/BANDAGES/DRESSINGS) ×1 IMPLANT
DRAPE LAPAROTOMY 100X72 PEDS (DRAPES) ×3 IMPLANT
DRAPE UTILITY XL STRL (DRAPES) ×3 IMPLANT
ELECT COATED BLADE 2.86 ST (ELECTRODE) ×3 IMPLANT
ELECT REM PT RETURN 9FT ADLT (ELECTROSURGICAL) ×3
ELECTRODE REM PT RTRN 9FT ADLT (ELECTROSURGICAL) ×1 IMPLANT
GLOVE BIO SURGEON STRL SZ7 (GLOVE) ×3 IMPLANT
GLOVE BIOGEL PI IND STRL 7.5 (GLOVE) ×2 IMPLANT
GLOVE BIOGEL PI IND STRL 8 (GLOVE) ×1 IMPLANT
GLOVE BIOGEL PI INDICATOR 7.5 (GLOVE) ×4
GLOVE BIOGEL PI INDICATOR 8 (GLOVE) ×2
GLOVE ECLIPSE 8.0 STRL XLNG CF (GLOVE) ×3 IMPLANT
GOWN STRL REUS W/ TWL LRG LVL3 (GOWN DISPOSABLE) ×2 IMPLANT
GOWN STRL REUS W/TWL LRG LVL3 (GOWN DISPOSABLE) ×4
NEEDLE HYPO 25X1 1.5 SAFETY (NEEDLE) ×3 IMPLANT
NS IRRIG 1000ML POUR BTL (IV SOLUTION) IMPLANT
PACK BASIN DAY SURGERY FS (CUSTOM PROCEDURE TRAY) ×3 IMPLANT
PENCIL BUTTON HOLSTER BLD 10FT (ELECTRODE) ×3 IMPLANT
SLEEVE SCD COMPRESS KNEE MED (MISCELLANEOUS) ×3 IMPLANT
SPONGE LAP 4X18 X RAY DECT (DISPOSABLE) ×3 IMPLANT
STAPLER VISISTAT 35W (STAPLE) IMPLANT
STRIP CLOSURE SKIN 1/2X4 (GAUZE/BANDAGES/DRESSINGS) IMPLANT
SUT MON AB 4-0 PC3 18 (SUTURE) ×3 IMPLANT
SUT VIC AB 2-0 SH 27 (SUTURE) ×2
SUT VIC AB 2-0 SH 27XBRD (SUTURE) ×1 IMPLANT
SUT VICRYL 3-0 CR8 SH (SUTURE) ×3 IMPLANT
SUT VICRYL AB 3 0 TIES (SUTURE) IMPLANT
SYR CONTROL 10ML LL (SYRINGE) ×3 IMPLANT
TOWEL OR 17X24 6PK STRL BLUE (TOWEL DISPOSABLE) ×6 IMPLANT
TOWEL OR NON WOVEN STRL DISP B (DISPOSABLE) ×3 IMPLANT
TUBE CONNECTING 20'X1/4 (TUBING)
TUBE CONNECTING 20X1/4 (TUBING) IMPLANT
YANKAUER SUCT BULB TIP NO VENT (SUCTIONS) IMPLANT

## 2016-07-26 NOTE — Transfer of Care (Signed)
Immediate Anesthesia Transfer of Care Note  Patient: RAIF CHACHERE  Procedure(s) Performed: Procedure(s): EXCISION OF LEFT THIGH MASS (Left)  Patient Location: PACU  Anesthesia Type:General  Level of Consciousness: awake, sedated and patient cooperative  Airway & Oxygen Therapy: Patient Spontanous Breathing and Patient connected to face mask oxygen  Post-op Assessment: Report given to RN and Post -op Vital signs reviewed and stable  Post vital signs: Reviewed and stable  Last Vitals:  Vitals:   07/26/16 0638  BP: 117/78  Pulse: (!) 55  Resp: 18  Temp: 36.4 C    Last Pain:  Vitals:   07/26/16 0638  TempSrc: Oral         Complications: No apparent anesthesia complications

## 2016-07-26 NOTE — Anesthesia Procedure Notes (Signed)
Procedure Name: LMA Insertion Date/Time: 07/26/2016 7:38 AM Performed by: Lyndee Leo Pre-anesthesia Checklist: Patient identified, Emergency Drugs available, Suction available and Patient being monitored Patient Re-evaluated:Patient Re-evaluated prior to inductionOxygen Delivery Method: Circle system utilized Preoxygenation: Pre-oxygenation with 100% oxygen Intubation Type: IV induction Ventilation: Mask ventilation without difficulty LMA: LMA inserted LMA Size: 5.0 Number of attempts: 1 Airway Equipment and Method: Bite block Placement Confirmation: positive ETCO2 Tube secured with: Tape Dental Injury: Teeth and Oropharynx as per pre-operative assessment

## 2016-07-26 NOTE — Anesthesia Preprocedure Evaluation (Signed)
Anesthesia Evaluation  Patient identified by MRN, date of birth, ID band Patient awake    Reviewed: Allergy & Precautions, NPO status , Patient's Chart, lab work & pertinent test results  Airway Mallampati: II  TM Distance: >3 FB Neck ROM: Full    Dental no notable dental hx.    Pulmonary neg pulmonary ROS,    Pulmonary exam normal breath sounds clear to auscultation       Cardiovascular negative cardio ROS Normal cardiovascular exam Rhythm:Regular Rate:Normal     Neuro/Psych negative neurological ROS  negative psych ROS   GI/Hepatic negative GI ROS, Neg liver ROS,   Endo/Other  negative endocrine ROS  Renal/GU negative Renal ROS  negative genitourinary   Musculoskeletal negative musculoskeletal ROS (+)   Abdominal   Peds negative pediatric ROS (+)  Hematology negative hematology ROS (+)   Anesthesia Other Findings   Reproductive/Obstetrics negative OB ROS                             Anesthesia Physical Anesthesia Plan  ASA: I  Anesthesia Plan: General   Post-op Pain Management:    Induction: Intravenous  PONV Risk Score and Plan: 1 and Ondansetron and Dexamethasone  Airway Management Planned: LMA  Additional Equipment:   Intra-op Plan:   Post-operative Plan: Extubation in OR  Informed Consent: I have reviewed the patients History and Physical, chart, labs and discussed the procedure including the risks, benefits and alternatives for the proposed anesthesia with the patient or authorized representative who has indicated his/her understanding and acceptance.   Dental advisory given  Plan Discussed with: CRNA and Surgeon  Anesthesia Plan Comments:         Anesthesia Quick Evaluation

## 2016-07-26 NOTE — H&P (Signed)
Paul Dickson is an 66 y.o. male.   Chief Complaint: left thigh mass HPI: Pt presents for excision of left thigh mass. MRI done and show mass in vastus medialis.  It is a myxoid tumor on core biopsy.  Denies pain weakness or numbness.   Past Medical History:  Diagnosis Date  . Medical history non-contributory     Past Surgical History:  Procedure Laterality Date  . [place]   rt  . BACK SURGERY  1985   lumb lam  . COLONOSCOPY    . 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 No pertinent family history. Social History:  reports that he has never smoked. He has never used smokeless tobacco. He reports that he drinks alcohol. He reports that he does not use drugs.  Allergies: No Known Allergies  Medications Prior to Admission  Medication Sig Dispense Refill  . Multiple Vitamins-Minerals (MULTIVITAMIN WITH MINERALS) tablet Take 1 tablet by mouth daily.      No results found for this or any previous visit (from the past 48 hour(s)). No results found.  Review of Systems  Constitutional: Negative for chills and fever.  Respiratory: Negative for cough.   Cardiovascular: Negative for chest pain.  Musculoskeletal: Negative for myalgias.  Skin: Negative for rash.    Blood pressure 117/78, pulse (!) 55, temperature 97.5 F (36.4 C), temperature source Oral, resp. rate 18, height 6\' 2"  (1.88 m), weight 80.8 kg (178 lb 2 oz), SpO2 100 %. Physical Exam  Constitutional: He is oriented to person, place, and time. He appears well-developed and well-nourished.  HENT:  Head: Normocephalic.  Eyes: Pupils are equal, round, and reactive to light.  Neck: Normal range of motion.  Cardiovascular: Normal rate.    Respiratory: Effort normal.  GI: Soft. There is no tenderness.  Musculoskeletal:  4 cm left thigh mass mobile firm medial proximal thigh region   Neurological: He is alert and oriented to person, place, and time.  Non focal   Psychiatric: He has a normal mood and affect. His behavior is normal.     Assessment/Plan Myxoid tumor left thigh Excision discussed  MRI reviewed. Discussed risk of bleeding, infection, nerve injury , numbness weakness of leg, swelling pain, need for additional surgery,  Blood clots   CVA, MI, death and the need for other treatments or surgery.  He agrees to proceed.   Tari Lecount A., MD 07/26/2016, 7:05 AM

## 2016-07-26 NOTE — Interval H&P Note (Signed)
History and Physical Interval Note:  07/26/2016 7:10 AM  Paul Dickson  has presented today for surgery, with the diagnosis of MYXOID TUMOR OF LEFT LEG  The various methods of treatment have been discussed with the patient and family. After consideration of risks, benefits and other options for treatment, the patient has consented to  Procedure(s): EXCISION OF LEFT THIGH MASS (Left) as a surgical intervention .  The patient's history has been reviewed, patient examined, no change in status, stable for surgery.  I have reviewed the patient's chart and labs.  Questions were answered to the patient's satisfaction.     Courtney Bellizzi A.

## 2016-07-26 NOTE — Anesthesia Postprocedure Evaluation (Signed)
Anesthesia Post Note  Patient: Paul Dickson  Procedure(s) Performed: Procedure(s) (LRB): EXCISION OF LEFT THIGH MASS (Left)     Patient location during evaluation: PACU Anesthesia Type: General Level of consciousness: awake and alert Pain management: pain level controlled Vital Signs Assessment: post-procedure vital signs reviewed and stable Respiratory status: spontaneous breathing, nonlabored ventilation, respiratory function stable and patient connected to nasal cannula oxygen Cardiovascular status: blood pressure returned to baseline and stable Postop Assessment: no signs of nausea or vomiting Anesthetic complications: no    Last Vitals:  Vitals:   07/26/16 0845 07/26/16 0900  BP: 115/76 120/75  Pulse: 64 (!) 59  Resp: 12 13  Temp:      Last Pain:  Vitals:   07/26/16 0900  TempSrc:   PainSc: 0-No pain                 Decarlo Rivet S

## 2016-07-26 NOTE — Op Note (Signed)
Preoperative diagnosis: Left thigh mass submuscular 4 cm  Postoperative diagnosis: Same  Procedure: Excision of left thigh mass submuscular 4 cm  Surgeon: Erroll Luna M.D.  Anesthesia: LMA with 0.25% Sensorcaine local with epinephrine  EBL: 10 mL  Specimen: Left thigh mass to pathology  Drains: None  Indications for procedure: The patient presents for excision of a deep left thigh mass. This is been present for at least a year and slowly growing. MRI was done which showed a 4 cm well encapsulated mass originating within the left vastus medialis muscle on the proximal thigh. This is well away from the superficial femoral vessels and deep femoral vessels. He had no neurological problems. Core biopsy was done preoperatively which showed a low-grade myxoid tumor with the pathology favoring a benign process. We discussed the pros and cons of excision. We discussed potential complications of doing this. After discussion of the above he wished to proceed with excision.The procedure has been discussed with the patient.  Alternative therapies have been discussed with the patient.  Operative risks include bleeding,  Infection,  Organ injury,  Nerve injury,  Blood vessel injury,  DVT,  Pulmonary embolism,  Death,  And possible reoperation.  Medical management risks include worsening of present situation.  The success of the procedure is 50 -90 % at treating patients symptoms.  The patient understands and agrees to proceed.   Description of procedure: The patient was met in the holding area. The mass was marked in the left medial thigh. MRI images were reviewed. Questions are answered. He is taken back the operating room and placed upon the OR table. After induction of LMA anesthesia the left thigh was prepped and draped in sterile fashion and a timeout was done. Proper patient, side and procedure were verified. He received preoperative antibiotics. The masses palpated. Incision was made in the medial left  thigh. Dissection was carried down through the subcutaneous fat until we encountered the vastus medialis muscle. I was able to open the fascia in separate the muscle fibers to identify the 4 cm mass. It was well encapsulated. Rare to dissect this away carefully from the deeper profunda femoris vessels. This was not attached to the muscle whatsoever. We dissected down to the base the mass which was in the proximal vastus medialis muscle. Here was attached to the muscle. I took a cough of normal muscle tissue with a mass at this point. The saphenous nerve was also in the operative field was retracted away. There are smaller branches from this and had to be sacrificed to remove the mass. This was done with cautery.  The mass was sent to pathology. The operative field was irrigated. Hemostasis achieved. I closed the fascia of the vastus medialis muscle with 2 O vicryl.  4 Monocryl was used to close skin. Dermabond applied. All final counts found to be correct. The patient was awoke extubated and  taken to recovery in satisfactory condition.

## 2016-07-26 NOTE — Discharge Instructions (Signed)
GENERAL SURGERY: POST OP INSTRUCTIONS ° °###################################################################### ° °EAT °Gradually transition to a high fiber diet with a fiber supplement over the next few weeks after discharge.  Start with a pureed / full liquid diet (see below) ° °WALK °Walk an hour a day.  Control your pain to do that.   ° °CONTROL PAIN °Control pain so that you can walk, sleep, tolerate sneezing/coughing, go up/down stairs. ° °HAVE A BOWEL MOVEMENT DAILY °Keep your bowels regular to avoid problems.  OK to try a laxative to override constipation.  OK to use an antidairrheal to slow down diarrhea.  Call if not better after 2 tries ° °CALL IF YOU HAVE PROBLEMS/CONCERNS °Call if you are still struggling despite following these instructions. °Call if you have concerns not answered by these instructions ° °###################################################################### ° ° ° °1. DIET: Follow a light bland diet the first 24 hours after arrival home, such as soup, liquids, crackers, etc.  Be sure to include lots of fluids daily.  Avoid fast food or heavy meals as your are more likely to get nauseated.   °2. Take your usually prescribed home medications unless otherwise directed. °3. PAIN CONTROL: °a. Pain is best controlled by a usual combination of three different methods TOGETHER: °i. Ice/Heat °ii. Over the counter pain medication °iii. Prescription pain medication °b. Most patients will experience some swelling and bruising around the incisions.  Ice packs or heating pads (30-60 minutes up to 6 times a day) will help. Use ice for the first few days to help decrease swelling and bruising, then switch to heat to help relax tight/sore spots and speed recovery.  Some people prefer to use ice alone, heat alone, alternating between ice & heat.  Experiment to what works for you.  Swelling and bruising can take several weeks to resolve.   °c. It is helpful to take an over-the-counter pain medication  regularly for the first few weeks.  Choose one of the following that works best for you: °i. Naproxen (Aleve, etc)  Two 220mg tabs twice a day °ii. Ibuprofen (Advil, etc) Three 200mg tabs four times a day (every meal & bedtime) °iii. Acetaminophen (Tylenol, etc) 500-650mg four times a day (every meal & bedtime) °d. A  prescription for pain medication (such as oxycodone, hydrocodone, etc) should be given to you upon discharge.  Take your pain medication as prescribed.  °i. If you are having problems/concerns with the prescription medicine (does not control pain, nausea, vomiting, rash, itching, etc), please call us [phone] to see if we need to switch you to a different pain medicine that will work better for you and/or control your side effect better. °ii. If you need a refill on your pain medication, please contact your pharmacy.  They will contact our office to request authorization. Prescriptions will not be filled after 5 pm or on week-ends. °4. Avoid getting constipated.  Between the surgery and the pain medications, it is common to experience some constipation.  Increasing fluid intake and taking a fiber supplement (such as Metamucil, Citrucel, FiberCon, MiraLax, etc) 1-2 times a day regularly will usually help prevent this problem from occurring.  A mild laxative (prune juice, Milk of Magnesia, MiraLax, etc) should be taken according to package directions if there are no bowel movements after 48 hours.   °5. Wash / shower every day.  You may shower over the dressings as they are waterproof.  Continue to shower over incision(s) after the dressing is off. °6. Remove your waterproof bandages   5 days after surgery.  You may leave the incision open to air.  You may have skin tapes (Steri Strips) covering the incision(s).  Leave them on until one week, then remove.  You may replace a dressing/Band-Aid to cover the incision for comfort if you wish.  ° ° ° ° °7. ACTIVITIES as tolerated:   °a. You may resume  regular (light) daily activities beginning the next day--such as daily self-care, walking, climbing stairs--gradually increasing activities as tolerated.  If you can walk 30 minutes without difficulty, it is safe to try more intense activity such as jogging, treadmill, bicycling, low-impact aerobics, swimming, etc. °b. Save the most intensive and strenuous activity for last such as sit-ups, heavy lifting, contact sports, etc  Refrain from any heavy lifting or straining until you are off narcotics for pain control.   °c. DO NOT PUSH THROUGH PAIN.  Let pain be your guide: If it hurts to do something, don't do it.  Pain is your body warning you to avoid that activity for another week until the pain goes down. °d. You may drive when you are no longer taking prescription pain medication, you can comfortably wear a seatbelt, and you can safely maneuver your car and apply brakes. °e. You may have sexual intercourse when it is comfortable.  °8. FOLLOW UP in our office °a. Please call [facility] at [phone] to set up an appointment to see your surgeon in the office for a follow-up appointment approximately 2-3 weeks after your surgery. °b. Make sure that you call for this appointment the day you arrive home to insure a convenient appointment time. °9. IF YOU HAVE DISABILITY OR FAMILY LEAVE FORMS, BRING THEM TO THE OFFICE FOR PROCESSING.  DO NOT GIVE THEM TO YOUR DOCTOR. ° ° °WHEN TO CALL US [phone]: °1. Poor pain control °2. Reactions / problems with new medications (rash/itching, nausea, etc)  °3. Fever over 101.5 F (38.5 C) °4. Worsening swelling or bruising °5. Continued bleeding from incision. °6. Increased pain, redness, or drainage from the incision °7. Difficulty breathing / swallowing ° ° The clinic staff is available to answer your questions during regular business hours (8:30am-5pm).  Please don’t hesitate to call and ask to speak to one of our nurses for clinical concerns.  ° If you have a medical emergency,  go to the nearest emergency room or call 911. ° A surgeon from [facility] is always on call at the hospitals ° ° °[facility] °[address], [address], [place], [place]  [address] ? °MAIN: [phone] ? TOLL FREE: 1-800-359-8415 ?  °FAX [phone] °[email or web address] ° ° °Post Anesthesia Home Care Instructions ° °Activity: °Get plenty of rest for the remainder of the day. A responsible individual must stay with you for 24 hours following the procedure.  °For the next 24 hours, DO NOT: °-Drive a car °-Operate machinery °-Drink alcoholic beverages °-Take any medication unless instructed by your physician °-Make any legal decisions or sign important papers. ° °Meals: °Start with liquid foods such as gelatin or soup. Progress to regular foods as tolerated. Avoid greasy, spicy, heavy foods. If nausea and/or vomiting occur, drink only clear liquids until the nausea and/or vomiting subsides. Call your physician if vomiting continues. ° °Special Instructions/Symptoms: °Your throat may feel dry or sore from the anesthesia or the breathing tube placed in your throat during surgery. If this causes discomfort, gargle with warm salt water. The discomfort should disappear within 24 hours. ° °  If you had a scopolamine patch placed behind your ear for the management of post- operative nausea and/or vomiting: ° °1. The medication in the patch is effective for 72 hours, after which it should be removed.  Wrap patch in a tissue and discard in the trash. Wash hands thoroughly with soap and water. °2. You may remove the patch earlier than 72 hours if you experience unpleasant side effects which may include dry mouth, dizziness or visual disturbances. °3. Avoid touching the patch. Wash your hands with soap and water after contact with the patch. °  ° °

## 2016-07-27 ENCOUNTER — Encounter (HOSPITAL_BASED_OUTPATIENT_CLINIC_OR_DEPARTMENT_OTHER): Payer: Self-pay | Admitting: Surgery

## 2016-08-03 ENCOUNTER — Encounter (HOSPITAL_COMMUNITY): Payer: Self-pay

## 2016-08-26 ENCOUNTER — Encounter: Payer: Self-pay | Admitting: Radiation Oncology

## 2016-08-31 NOTE — Progress Notes (Signed)
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 Erroll Luna, MD seen 08/24/16  Incision clean and dry,  Post Op, Excision  Of left thigh mass at submuscular 4cm   Diagnosis 05/14/16: Soft Tissue Needle Core Biopsy, Left thigh - MYXOID TUMOR  Pain on a scale of 0-10 is:        Numbness or weakness in extremities (please describe):    Ambulatory status? Walker? Wheelchair?:   SAFETY ISSUES:  Prior radiation? NO  Pacemaker/ICD? NO  Is the patient on methotrexate? NO  Additional Complaints / other details:  Married, no tobaco use ever,occasional wine, no drug use, hx back surgery, excision Morton's neuroma,left foot,   Allergies: NKA

## 2016-09-02 ENCOUNTER — Ambulatory Visit: Admission: RE | Admit: 2016-09-02 | Payer: Medicare Other | Source: Ambulatory Visit

## 2016-09-02 ENCOUNTER — Ambulatory Visit
Admission: RE | Admit: 2016-09-02 | Discharge: 2016-09-02 | Disposition: A | Discharge status: Home / Self Care | Payer: Medicare Other | Source: Ambulatory Visit | Attending: Radiation Oncology | Admitting: Radiation Oncology

## 2016-09-02 ENCOUNTER — Telehealth: Payer: Self-pay | Admitting: Radiation Oncology

## 2016-09-02 NOTE — Telephone Encounter (Signed)
I spoke with the patient by phone this morning. After reviewing his pathology results from [place] and from our pathology department, and in discussing with Dr. Lyndon Code and Dr. Lisbeth Renshaw, this is an indolent tumor and based in the biology, there would be low suspicion of recurrence or progression even though his margins were broadly positive. Due to this, we would not anticipate a role for radiotherapy, but would be happy to review this further with the patient at his discretion. He states agreement and understanding, and we will notify Dr. Brantley Stage regarding this discussion too.     Carola Rhine, PAC

## 2019-01-15 ENCOUNTER — Other Ambulatory Visit: Payer: Self-pay

## 2019-01-15 DIAGNOSIS — Z20822 Contact with and (suspected) exposure to covid-19: Secondary | ICD-10-CM

## 2019-01-16 LAB — NOVEL CORONAVIRUS, NAA: SARS-CoV-2, NAA: NOT DETECTED

## 2019-07-17 ENCOUNTER — Emergency Department (HOSPITAL_COMMUNITY): Payer: Medicare Other

## 2019-07-17 ENCOUNTER — Other Ambulatory Visit: Payer: Self-pay

## 2019-07-17 ENCOUNTER — Encounter (HOSPITAL_COMMUNITY): Payer: Self-pay | Admitting: Emergency Medicine

## 2019-07-17 ENCOUNTER — Emergency Department (HOSPITAL_COMMUNITY)
Admission: EM | Admit: 2019-07-17 | Discharge: 2019-07-17 | Disposition: A | Discharge status: Home / Self Care | Payer: Medicare Other | Attending: Emergency Medicine | Admitting: Emergency Medicine

## 2019-07-17 DIAGNOSIS — R2689 Other abnormalities of gait and mobility: Secondary | ICD-10-CM | POA: Insufficient documentation

## 2019-07-17 DIAGNOSIS — R269 Unspecified abnormalities of gait and mobility: Secondary | ICD-10-CM

## 2019-07-17 NOTE — ED Provider Notes (Signed)
[facility] DEPT Provider Note   CSN: [id number] Arrival date & time: 07/17/19  1038     History Chief Complaint  Patient presents with  . Gait Problem    Paul Dickson is a 69 y.o. male.  69 year old male who presents with several months of gait abnormality.  Patient states at times he has trouble with his balance.  Denies any associated headache, visual changes.  No weakness in his upper or lower extremities.  States that it seems to be positional.  Denies any tinnitus.  No palpitations.  Does not take any medications on a regular basis.  No prior evaluation for this with no prior history of same.        Past Medical History:  Diagnosis Date  . Medical history non-contributory     There are no problems to display for this patient.   Past Surgical History:  Procedure Laterality Date  . [place]   rt  . BACK SURGERY  1985   lumb lam  . COLONOSCOPY    . EXCISION MORTON'S NEUROMA Left 04/20/2012   Procedure: EXCISION INTERDIGITAL ([place]) NEUROMA SINGLE EACH;  Surgeon: Ninetta Lights, MD;  Location: [facility];  Service: Orthopedics;  Laterality: Left;  LEFT FOOT MORTONS NEUROMA BETWEEN 2ND & 3RD METARSAL HEADS  . LIPOMA EXCISION Left 07/26/2016   Procedure: EXCISION OF LEFT THIGH MASS;  Surgeon: Erroll Luna, MD;  Location: [facility];  Service: General;  Laterality: Left;       No family history on file.  Social History   Tobacco Use  . Smoking status: Never Smoker  . Smokeless tobacco: Never Used  Substance Use Topics  . Alcohol use: Yes    Comment: occ wine  . Drug use: No    Home Medications Prior to Admission medications   Medication Sig Start Date End Date Taking? Authorizing Provider  ibuprofen (ADVIL,MOTRIN) 800 MG tablet Take 1 tablet (800 mg total) by mouth every 8 (eight) hours as needed. 07/26/16   Cornett, Marcello Moores, MD  Multiple Vitamins-Minerals (MULTIVITAMIN  WITH MINERALS) tablet Take 1 tablet by mouth daily.    [provider]  oxyCODONE (OXY IR/ROXICODONE) 5 MG immediate release tablet Take 1-2 tablets (5-10 mg total) by mouth every 6 (six) hours as needed for severe pain. 07/26/16   Erroll Luna, MD    Allergies    Patient has no known allergies.  Review of Systems   Review of Systems  All other systems reviewed and are negative.   Physical Exam Updated Vital Signs BP 135/87 (BP Location: Right Arm)   Pulse 71   Temp 98 F (36.7 C) (Oral)   Resp 16   SpO2 98%   Physical Exam Vitals and nursing note reviewed.  Constitutional:      General: He is not in acute distress.    Appearance: Normal appearance. He is well-developed. He is not toxic-appearing.  HENT:     Head: Normocephalic and atraumatic.  Eyes:     General: Lids are normal.     Conjunctiva/sclera: Conjunctivae normal.     Pupils: Pupils are equal, round, and reactive to light.  Neck:     Thyroid: No thyroid mass.     Trachea: No tracheal deviation.  Cardiovascular:     Rate and Rhythm: Normal rate and regular rhythm.     Heart sounds: Normal heart sounds. No murmur. No gallop.   Pulmonary:     Effort: Pulmonary  effort is normal. No respiratory distress.     Breath sounds: Normal breath sounds. No stridor. No decreased breath sounds, wheezing, rhonchi or rales.  Abdominal:     General: Bowel sounds are normal. There is no distension.     Palpations: Abdomen is soft.     Tenderness: There is no abdominal tenderness. There is no rebound.  Musculoskeletal:        General: No tenderness. Normal range of motion.     Cervical back: Normal range of motion and neck supple.  Skin:    General: Skin is warm and dry.     Findings: No abrasion or rash.  Neurological:     General: No focal deficit present.     Mental Status: He is alert and oriented to person, place, and time.     GCS: GCS eye subscore is 4. GCS verbal subscore is 5. GCS motor subscore is 6.      Cranial Nerves: No cranial nerve deficit.     Sensory: No sensory deficit.     Motor: Motor function is intact.     Coordination: Coordination is intact.     Gait: Gait is intact.  Psychiatric:        Speech: Speech normal.        Behavior: Behavior normal.     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 Suspected stroke. EXAM: CT HEAD WITHOUT CONTRAST TECHNIQUE: Contiguous axial images were obtained from the base of the skull through the vertex without intravenous contrast. COMPARISON:  No priors. FINDINGS: Brain: Patchy areas of decreased attenuation are noted throughout the periventricular white matter of the cerebral hemispheres bilaterally, compatible with mild chronic microvascular ischemic disease. No evidence of acute infarction, hemorrhage, hydrocephalus, extra-axial collection or mass lesion/mass effect. Vascular: No hyperdense vessel or unexpected calcification. Skull: Normal. Negative for fracture or focal lesion. Sinuses/Orbits: No acute finding. Other: None. IMPRESSION: 1. No acute intracranial abnormalities. 2. Mild chronic microvascular ischemic changes in cerebral white matter, as above. Electronically Signed   By: Vinnie Langton M.D.   On: 07/17/2019 12:34    Procedures Procedures (including critical care time)  Medications Ordered in ED Medications - No data to display  ED Course  I have reviewed the triage vital signs and the nursing notes.  Pertinent labs & imaging results that were available during my care of the patient were reviewed by me and considered in my medical decision making (see chart for details).    MDM Rules/Calculators/A&P                      Patient's head CT without acute findings here.  His neurological  symmetrically normal.  Will give neurology referral as well as return precautions Final Clinical Impression(s) / ED Diagnoses Final diagnoses:  None    Rx / DC Orders ED Discharge Orders    None       Lacretia Leigh, MD 07/17/19 1348

## 2019-07-17 NOTE — ED Triage Notes (Signed)
Per pt, states he has been off balance for months-states he has to lean forward or spread feet wide to walk straight-states he walks like he is "intoxocated"-denies being dizzy, denies pain, no changes in vision...patient thinks he might have had a stroke-has not followed up with PCP

## 2019-07-17 NOTE — Discharge Instructions (Addendum)
Return here at once for persistent trouble walking, weakness in your extremities, loss of vision, severe headaches, or any other problems

## 2019-07-17 NOTE — ED Notes (Signed)
Pt verbalizes understanding of DC instructions. Pt belongings returned and is ambulatory out of ED.  

## 2019-09-06 ENCOUNTER — Ambulatory Visit: Payer: Medicare Other | Admitting: Neurology

## 2021-12-31 IMAGING — CT CT HEAD W/O CM
3 series · 16 of 47 positions shown, 19 images · non-contrast
Comparison: No priors.

CLINICAL DATA: 69-year-old male with history of ataxia. Suspected
stroke.

EXAM:
CT HEAD WITHOUT CONTRAST
TECHNIQUE: Contiguous axial images were obtained from the base of the skull
through the vertex without intravenous contrast.

[Series 2: head wo · axial · 0.48mm/px · z∈[-124,+21]mm · 10 of 35 slices shown, 13 images]
[im 3/35  brain]
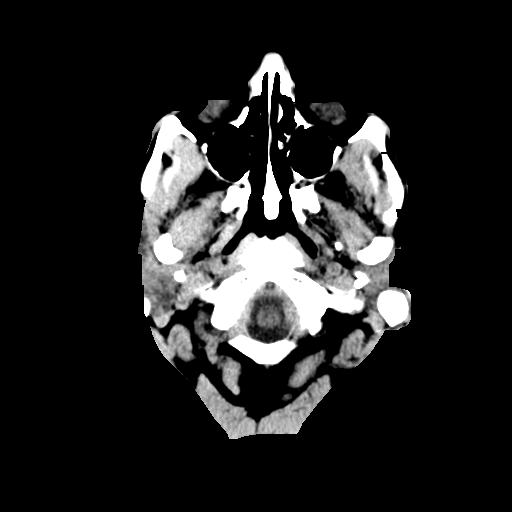
[im 3/35  bone]
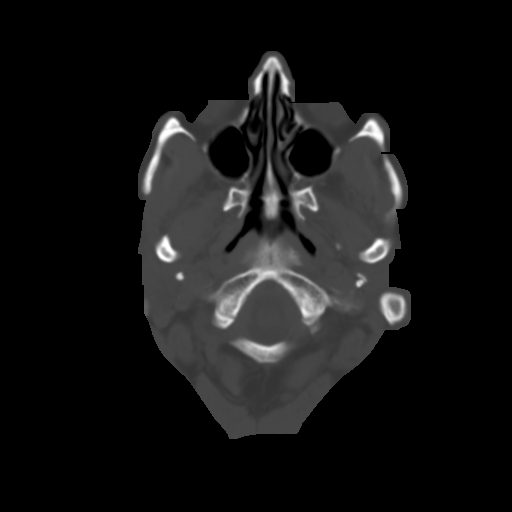
[im 6/35  brain]
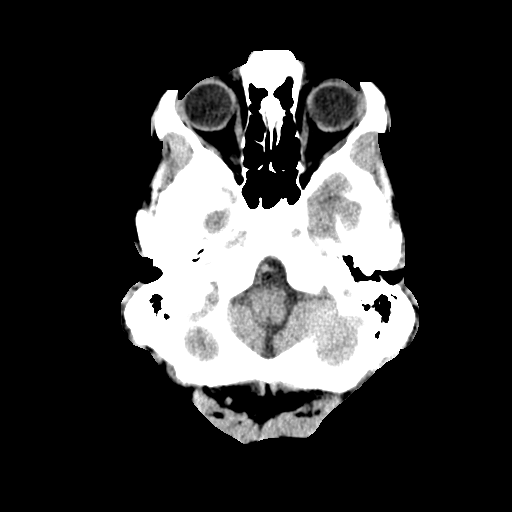
[im 10/35  brain]
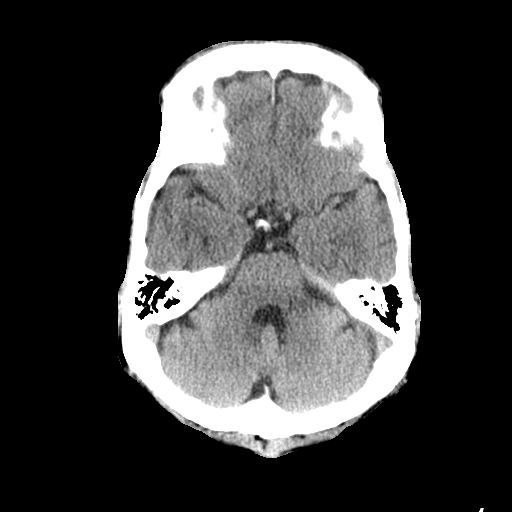
[im 12/35  brain]
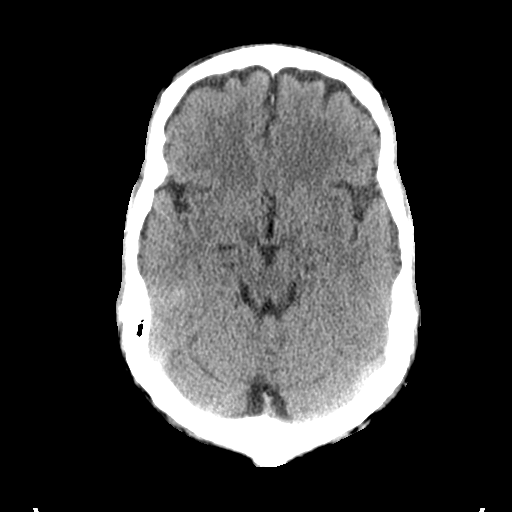
[im 16/35  brain]
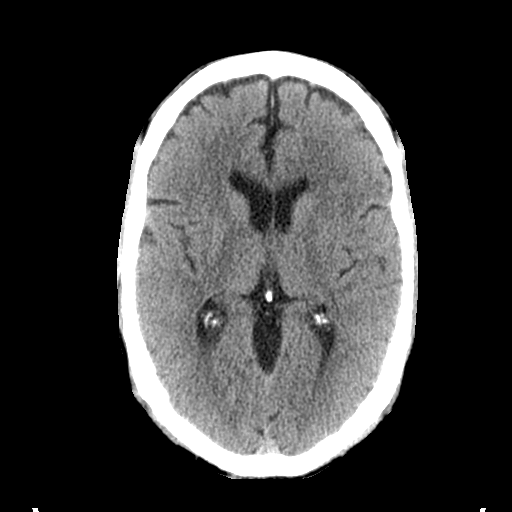
[im 16/35  bone]
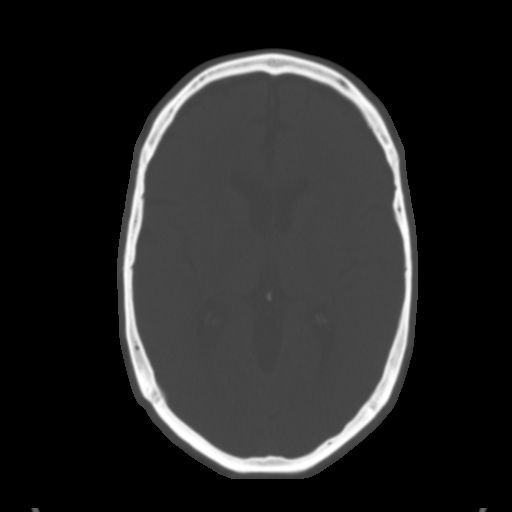
[im 19/35  brain]
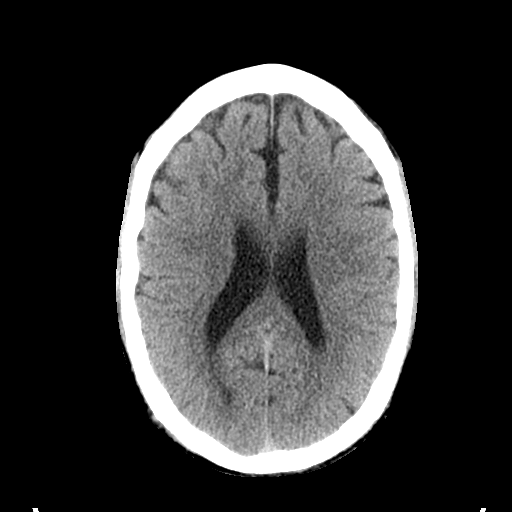
[im 23/35  brain]
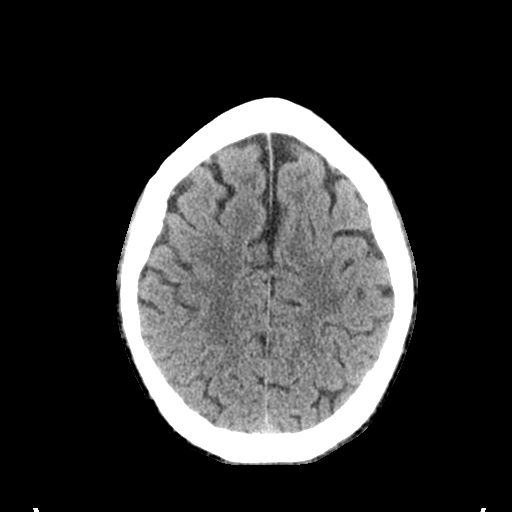
[im 26/35  brain]
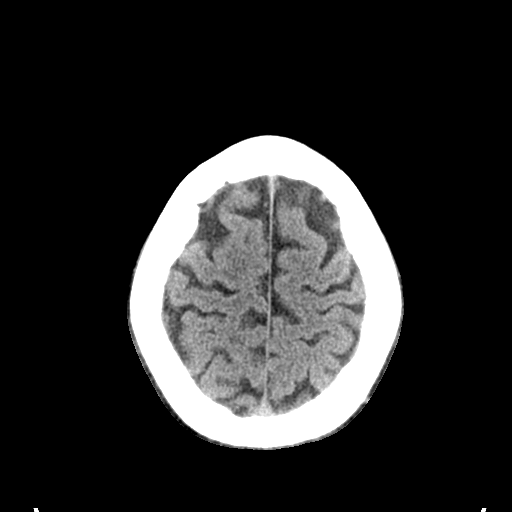
[im 29/35  brain]
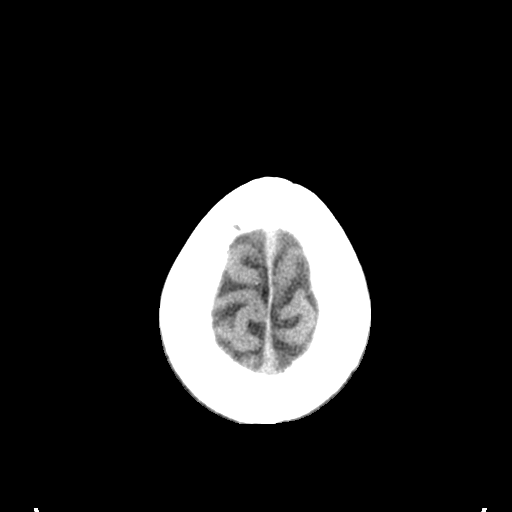
[im 29/35  bone]
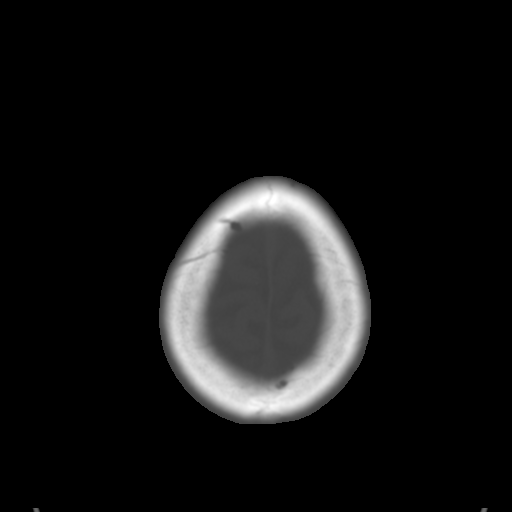
[im 32/35  brain]
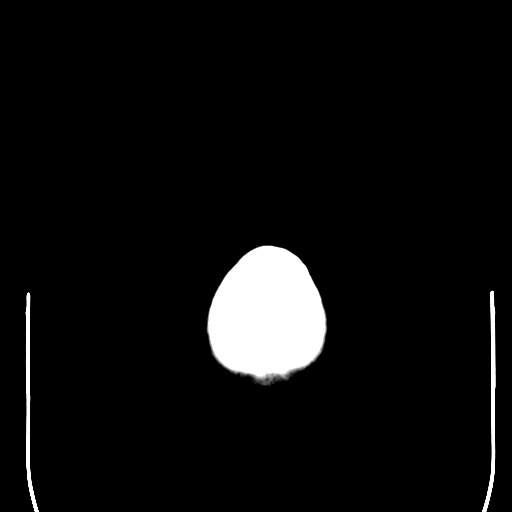

[Series 4: coronal soft tissue · coronal · 0.32mm/px · 3 of 72 slices shown]
[im 24/72  brain]
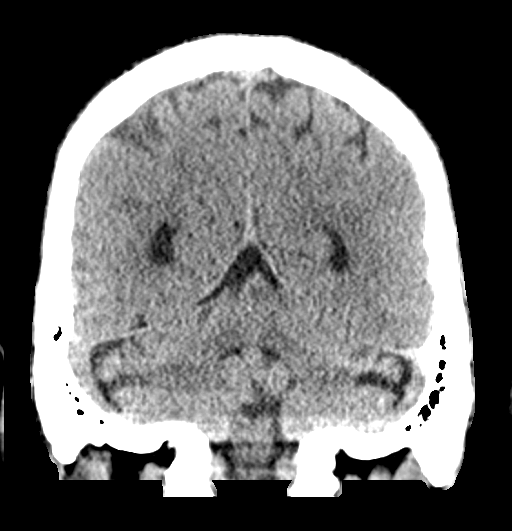
[im 32/72  brain]
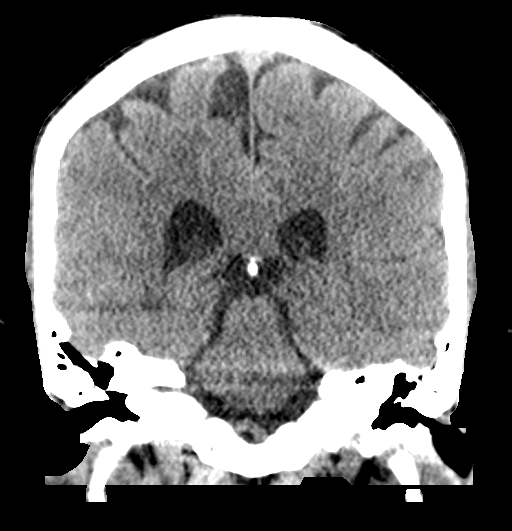
[im 40/72  brain]
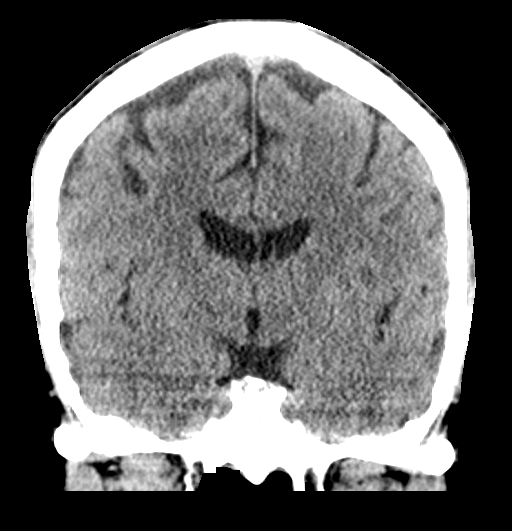

[Series 5: sagittal soft tissue · sagittal · 0.33mm/px · 3 of 56 slices shown]
[im 19/56  brain]
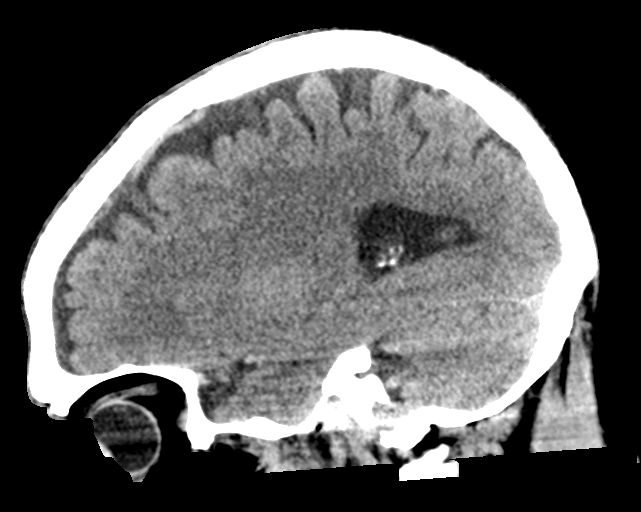
[im 28/56  brain]
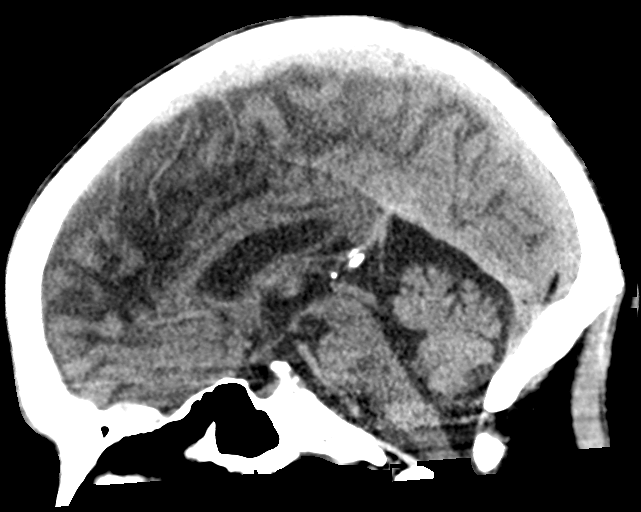
[im 37/56  brain]
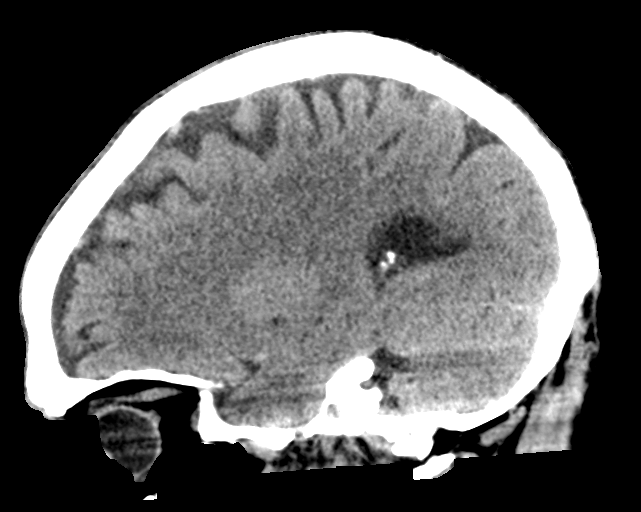

[16 of 47 positions shown; findings below may reference images not displayed]

FINDINGS: Brain: Patchy areas of decreased attenuation are noted throughout
the periventricular white matter of the cerebral hemispheres
bilaterally, compatible with mild chronic microvascular ischemic
disease. No evidence of acute infarction, hemorrhage, hydrocephalus,
extra-axial collection or mass lesion/mass effect.

Vascular: No hyperdense vessel or unexpected calcification.

Skull: Normal. Negative for fracture or focal lesion.

Sinuses/Orbits: No acute finding.

Other: None.
IMPRESSION: 1. No acute intracranial abnormalities.
2. Mild chronic microvascular ischemic changes in cerebral white
matter, as above.
